# Patient Record
Sex: Male | Born: 1950 | Race: White | Hispanic: No | Marital: Married | Smoking: Current every day smoker
Health system: Southern US, Community
[De-identification: ages and names within clinical notes are randomized; demographics above are authoritative.]

## PROBLEM LIST (undated history)

## (undated) DIAGNOSIS — M5030 Other cervical disc degeneration, unspecified cervical region: Secondary | ICD-10-CM

## (undated) DIAGNOSIS — F329 Major depressive disorder, single episode, unspecified: Secondary | ICD-10-CM

## (undated) DIAGNOSIS — F32A Depression, unspecified: Secondary | ICD-10-CM

## (undated) DIAGNOSIS — F419 Anxiety disorder, unspecified: Secondary | ICD-10-CM

## (undated) DIAGNOSIS — I10 Essential (primary) hypertension: Secondary | ICD-10-CM

## (undated) DIAGNOSIS — G4700 Insomnia, unspecified: Secondary | ICD-10-CM

## (undated) HISTORY — PX: TONSILLECTOMY: SUR1361

## (undated) HISTORY — DX: Major depressive disorder, single episode, unspecified: F32.9

## (undated) HISTORY — PX: OTHER SURGICAL HISTORY: SHX169

## (undated) HISTORY — DX: Depression, unspecified: F32.A

## (undated) HISTORY — PX: VASECTOMY: SHX75

## (undated) HISTORY — DX: Anxiety disorder, unspecified: F41.9

## (undated) HISTORY — DX: Other cervical disc degeneration, unspecified cervical region: M50.30

## (undated) HISTORY — DX: Insomnia, unspecified: G47.00

## (undated) HISTORY — DX: Essential (primary) hypertension: I10

---

## 1999-04-01 HISTORY — PX: COLONOSCOPY: SHX174

## 1999-09-23 ENCOUNTER — Encounter (HOSPITAL_BASED_OUTPATIENT_CLINIC_OR_DEPARTMENT_OTHER): Payer: Self-pay | Admitting: General Surgery

## 1999-09-25 ENCOUNTER — Encounter (INDEPENDENT_AMBULATORY_CARE_PROVIDER_SITE_OTHER): Payer: Self-pay | Admitting: *Deleted

## 1999-09-25 ENCOUNTER — Ambulatory Visit (HOSPITAL_COMMUNITY): Admission: RE | Admit: 1999-09-25 | Discharge: 1999-09-25 | Payer: Self-pay | Admitting: General Surgery

## 2005-01-14 ENCOUNTER — Ambulatory Visit: Payer: Self-pay | Admitting: Internal Medicine

## 2005-09-04 ENCOUNTER — Ambulatory Visit: Payer: Self-pay | Admitting: Internal Medicine

## 2005-09-05 ENCOUNTER — Ambulatory Visit: Payer: Self-pay | Admitting: Internal Medicine

## 2006-01-05 ENCOUNTER — Ambulatory Visit: Payer: Self-pay | Admitting: Internal Medicine

## 2006-07-31 DIAGNOSIS — Z9089 Acquired absence of other organs: Secondary | ICD-10-CM

## 2006-08-13 ENCOUNTER — Encounter: Payer: Self-pay | Admitting: Internal Medicine

## 2006-08-13 ENCOUNTER — Ambulatory Visit: Payer: Self-pay | Admitting: Internal Medicine

## 2006-08-13 LAB — CONVERTED CEMR LAB
ALT: 38 units/L (ref 0–40)
AST: 30 units/L (ref 0–37)
Albumin: 4.1 g/dL (ref 3.5–5.2)
Alkaline Phosphatase: 68 units/L (ref 39–117)
BUN: 26 mg/dL — ABNORMAL HIGH (ref 6–23)
Basophils Absolute: 0 10*3/uL (ref 0.0–0.1)
Basophils Relative: 0.5 % (ref 0.0–1.0)
Bilirubin, Direct: 0.1 mg/dL (ref 0.0–0.3)
CO2: 30 meq/L (ref 19–32)
Calcium: 9.9 mg/dL (ref 8.4–10.5)
Chloride: 102 meq/L (ref 96–112)
Cholesterol: 183 mg/dL (ref 0–200)
Creatinine, Ser: 1.1 mg/dL (ref 0.4–1.5)
Creatinine,U: 103.2 mg/dL
Direct LDL: 63.3 mg/dL
Eosinophils Absolute: 0.1 10*3/uL (ref 0.0–0.6)
Eosinophils Relative: 1.3 % (ref 0.0–5.0)
Free T4: 0.7 ng/dL (ref 0.6–1.6)
GFR calc Af Amer: 89 mL/min
GFR calc non Af Amer: 73 mL/min
Glucose, Bld: 131 mg/dL — ABNORMAL HIGH (ref 70–99)
HCT: 43.8 % (ref 39.0–52.0)
HDL: 40.6 mg/dL (ref >39.0)
Hemoglobin: 15.2 g/dL (ref 13.0–17.0)
Hgb A1c MFr Bld: 5.8 % (ref 4.6–6.0)
Lymphocytes Relative: 22.8 % (ref 12.0–46.0)
MCHC: 34.7 g/dL (ref 30.0–36.0)
MCV: 90.7 fL (ref 78.0–100.0)
Microalb Creat Ratio: 25.2 mg/g (ref 0.0–30.0)
Microalb, Ur: 2.6 mg/dL — ABNORMAL HIGH (ref 0.0–1.9)
Monocytes Absolute: 0.6 10*3/uL (ref 0.2–0.7)
Monocytes Relative: 8.9 % (ref 3.0–11.0)
Neutro Abs: 4.5 10*3/uL (ref 1.4–7.7)
Neutrophils Relative %: 66.5 % (ref 43.0–77.0)
Platelets: 205 10*3/uL (ref 150–400)
Potassium: 4.3 meq/L (ref 3.5–5.1)
RBC: 4.83 M/uL (ref 4.22–5.81)
RDW: 11.8 % (ref 11.5–14.6)
Sodium: 137 meq/L (ref 135–145)
T3, Free: 2.9 pg/mL (ref 2.3–4.2)
TSH: 2.57 microintl units/mL (ref 0.35–5.50)
Testosterone: 213.69 ng/dL — ABNORMAL LOW (ref 350.00–890)
Total Bilirubin: 0.8 mg/dL (ref 0.3–1.2)
Total CHOL/HDL Ratio: 4.5
Total Protein: 7.6 g/dL (ref 6.0–8.3)
Triglycerides: 429 mg/dL (ref 0–149)
VLDL: 86 mg/dL — ABNORMAL HIGH (ref 0–40)
WBC: 6.8 10*3/uL (ref 4.5–10.5)

## 2006-09-03 ENCOUNTER — Encounter: Payer: Self-pay | Admitting: Internal Medicine

## 2006-10-07 ENCOUNTER — Encounter: Payer: Self-pay | Admitting: Internal Medicine

## 2006-12-20 ENCOUNTER — Emergency Department (HOSPITAL_COMMUNITY): Admission: EM | Admit: 2006-12-20 | Discharge: 2006-12-21 | Payer: Self-pay | Admitting: Emergency Medicine

## 2006-12-23 ENCOUNTER — Encounter: Payer: Self-pay | Admitting: Internal Medicine

## 2007-01-21 ENCOUNTER — Ambulatory Visit: Payer: Self-pay | Admitting: Internal Medicine

## 2007-01-24 LAB — CONVERTED CEMR LAB
Creatinine,U: 169.3 mg/dL
Hgb A1c MFr Bld: 5.8 % (ref 4.6–6.0)
Microalb Creat Ratio: 5.9 mg/g (ref 0.0–30.0)
Microalb, Ur: 1 mg/dL (ref 0.0–1.9)

## 2007-01-26 ENCOUNTER — Encounter (INDEPENDENT_AMBULATORY_CARE_PROVIDER_SITE_OTHER): Payer: Self-pay | Admitting: *Deleted

## 2007-02-05 ENCOUNTER — Encounter: Payer: Self-pay | Admitting: Internal Medicine

## 2007-06-14 ENCOUNTER — Encounter: Payer: Self-pay | Admitting: Internal Medicine

## 2007-10-05 ENCOUNTER — Telehealth (INDEPENDENT_AMBULATORY_CARE_PROVIDER_SITE_OTHER): Payer: Self-pay | Admitting: *Deleted

## 2007-10-05 ENCOUNTER — Ambulatory Visit: Payer: Self-pay | Admitting: Internal Medicine

## 2007-10-05 DIAGNOSIS — M5412 Radiculopathy, cervical region: Secondary | ICD-10-CM | POA: Insufficient documentation

## 2007-10-15 ENCOUNTER — Telehealth (INDEPENDENT_AMBULATORY_CARE_PROVIDER_SITE_OTHER): Payer: Self-pay | Admitting: *Deleted

## 2007-11-09 ENCOUNTER — Encounter (INDEPENDENT_AMBULATORY_CARE_PROVIDER_SITE_OTHER): Payer: Self-pay | Admitting: *Deleted

## 2007-11-09 ENCOUNTER — Telehealth (INDEPENDENT_AMBULATORY_CARE_PROVIDER_SITE_OTHER): Payer: Self-pay | Admitting: *Deleted

## 2007-11-24 ENCOUNTER — Ambulatory Visit: Payer: Self-pay | Admitting: Internal Medicine

## 2007-11-24 DIAGNOSIS — I10 Essential (primary) hypertension: Secondary | ICD-10-CM | POA: Insufficient documentation

## 2007-11-24 DIAGNOSIS — L723 Sebaceous cyst: Secondary | ICD-10-CM

## 2007-12-15 ENCOUNTER — Encounter: Admission: RE | Admit: 2007-12-15 | Discharge: 2008-02-07 | Payer: Self-pay | Admitting: Orthopedic Surgery

## 2008-02-16 ENCOUNTER — Telehealth (INDEPENDENT_AMBULATORY_CARE_PROVIDER_SITE_OTHER): Payer: Self-pay | Admitting: *Deleted

## 2008-02-16 DIAGNOSIS — E291 Testicular hypofunction: Secondary | ICD-10-CM | POA: Insufficient documentation

## 2008-02-22 ENCOUNTER — Encounter (INDEPENDENT_AMBULATORY_CARE_PROVIDER_SITE_OTHER): Payer: Self-pay | Admitting: *Deleted

## 2008-05-17 ENCOUNTER — Encounter: Payer: Self-pay | Admitting: Internal Medicine

## 2008-08-15 ENCOUNTER — Encounter: Payer: Self-pay | Admitting: Internal Medicine

## 2008-12-26 ENCOUNTER — Ambulatory Visit: Payer: Self-pay | Admitting: Internal Medicine

## 2008-12-26 DIAGNOSIS — N400 Enlarged prostate without lower urinary tract symptoms: Secondary | ICD-10-CM

## 2008-12-26 DIAGNOSIS — F329 Major depressive disorder, single episode, unspecified: Secondary | ICD-10-CM

## 2009-01-03 ENCOUNTER — Ambulatory Visit: Payer: Self-pay | Admitting: Internal Medicine

## 2009-01-05 LAB — CONVERTED CEMR LAB
ALT: 38 units/L (ref 0–53)
AST: 31 units/L (ref 0–37)
Albumin: 4.1 g/dL (ref 3.5–5.2)
Alkaline Phosphatase: 58 units/L (ref 39–117)
BUN: 24 mg/dL — ABNORMAL HIGH (ref 6–23)
Basophils Absolute: 0 10*3/uL (ref 0.0–0.1)
Basophils Relative: 0.6 % (ref 0.0–3.0)
Bilirubin, Direct: 0 mg/dL (ref 0.0–0.3)
CO2: 30 meq/L (ref 19–32)
Calcium: 9.7 mg/dL (ref 8.4–10.5)
Chloride: 102 meq/L (ref 96–112)
Cholesterol: 156 mg/dL (ref 0–200)
Creatinine, Ser: 1.2 mg/dL (ref 0.4–1.5)
Direct LDL: 112.2 mg/dL
Eosinophils Absolute: 0.1 10*3/uL (ref 0.0–0.7)
Eosinophils Relative: 1.6 % (ref 0.0–5.0)
GFR calc non Af Amer: 65.67 mL/min (ref >60)
Glucose, Bld: 138 mg/dL — ABNORMAL HIGH (ref 70–99)
HCT: 48.9 % (ref 39.0–52.0)
HDL: 38.5 mg/dL — ABNORMAL LOW (ref >39.00)
Hemoglobin: 16.3 g/dL (ref 13.0–17.0)
Lymphocytes Relative: 21.6 % (ref 12.0–46.0)
Lymphs Abs: 1.7 10*3/uL (ref 0.7–4.0)
MCHC: 33.4 g/dL (ref 30.0–36.0)
MCV: 96.6 fL (ref 78.0–100.0)
Monocytes Absolute: 0.5 10*3/uL (ref 0.1–1.0)
Monocytes Relative: 6.8 % (ref 3.0–12.0)
Neutro Abs: 5.5 10*3/uL (ref 1.4–7.7)
Neutrophils Relative %: 69.4 % (ref 43.0–77.0)
Platelets: 172 10*3/uL (ref 150.0–400.0)
Potassium: 5.4 meq/L — ABNORMAL HIGH (ref 3.5–5.1)
RBC: 5.06 M/uL (ref 4.22–5.81)
RDW: 11.6 % (ref 11.5–14.6)
Sodium: 140 meq/L (ref 135–145)
TSH: 2.57 microintl units/mL (ref 0.35–5.50)
Testosterone: 238.61 ng/dL — ABNORMAL LOW (ref 350.00–890.00)
Total Bilirubin: 0.8 mg/dL (ref 0.3–1.2)
Total CHOL/HDL Ratio: 4
Total Protein: 7.7 g/dL (ref 6.0–8.3)
Triglycerides: 230 mg/dL — ABNORMAL HIGH (ref 0.0–149.0)
VLDL: 46 mg/dL — ABNORMAL HIGH (ref 0.0–40.0)
WBC: 7.8 10*3/uL (ref 4.5–10.5)

## 2009-01-09 ENCOUNTER — Encounter (INDEPENDENT_AMBULATORY_CARE_PROVIDER_SITE_OTHER): Payer: Self-pay | Admitting: *Deleted

## 2009-01-09 LAB — CONVERTED CEMR LAB: Hgb A1c MFr Bld: 5.8 % (ref 4.6–6.5)

## 2009-01-10 ENCOUNTER — Ambulatory Visit: Payer: Self-pay | Admitting: Internal Medicine

## 2009-06-04 ENCOUNTER — Ambulatory Visit: Payer: Self-pay | Admitting: Internal Medicine

## 2009-06-04 DIAGNOSIS — H6980 Other specified disorders of Eustachian tube, unspecified ear: Secondary | ICD-10-CM

## 2009-10-09 ENCOUNTER — Ambulatory Visit: Payer: Self-pay | Admitting: Internal Medicine

## 2009-10-09 ENCOUNTER — Telehealth: Payer: Self-pay | Admitting: Internal Medicine

## 2009-10-09 DIAGNOSIS — B029 Zoster without complications: Secondary | ICD-10-CM | POA: Insufficient documentation

## 2010-01-10 ENCOUNTER — Telehealth (INDEPENDENT_AMBULATORY_CARE_PROVIDER_SITE_OTHER): Payer: Self-pay | Admitting: *Deleted

## 2010-04-30 NOTE — Progress Notes (Signed)
Summary: refill  Phone Note Refill Request Message from:  Fax from Pharmacy on January 10, 2010 11:52 AM  Refills Requested: Medication #1:  METOPROLOL TARTRATE 50 MG  TABS 1 by mouth bid [facility] - fax [phone]  Initial call taken by: Okey Regal [place],  January 10, 2010 11:58 AM    Prescriptions: METOPROLOL TARTRATE 50 MG  TABS (METOPROLOL TARTRATE) 1 by mouth bid  #180 x 1   Entered by:   Shonna Chock CMA   Authorized by:   Marga Melnick MD   Signed by:   Shonna Chock CMA on 01/10/2010   Method used:   Electronically to        [facility] [address] (retail)       [address]       [place], [place]  [address]       Ph: [phone] or [phone]       Fax: [phone]   RxID:   [id number]

## 2010-04-30 NOTE — Assessment & Plan Note (Signed)
Summary: FLUID IN RT EAR/RH.......Marland Kitchen   Vital Signs:  Patient profile:   60 year old male Weight:      192 pounds Temp:     98.8 degrees F oral Pulse rate:   72 / minute Resp:     15 per minute BP sitting:   130 / 78  (left arm)  Vitals Entered By: Jeremy Johann CMA (June 04, 2009 3:28 PM) CC: Fluid in right ear Comments REVIEWED MED LIST, PATIENT AGREED DOSE AND INSTRUCTION CORRECT    CC:  Fluid in right ear.  History of Present Illness: Bronchitis in [facility] 05/20/2009 after flying; Rx: steroids, antibiotics, Mucinex D. Tessalon perles Rxed by his brother, MD. Now sensation of water in R ear .  Allergies: 1)  ! Codeine  Review of Systems General:  Denies chills, fever, and sweats. ENT:  Complains of nasal congestion and sinus pressure; denies ear discharge; No frontal headache, facialpain or purulence. Resp:  Complains of cough; denies shortness of breath, sputum productive, and wheezing. Allergy:  Denies itching eyes and sneezing.  Physical Exam  General:  well-nourished,in no acute distress; alert,appropriate and cooperative throughout examination Ears:  R ear normal.  L TM dull Nose:  External nasal examination shows no deformity or inflammation. Nasal mucosa are pink and moist without lesions or exudates.decreased R nare Lungs:  Normal respiratory effort, chest expands symmetrically. Lungs are clear to auscultation, no crackles or wheezes. 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 Take as directed 2)  Lotensin 40 Mg Tabs (Benazepril hcl) .Marland Kitchen.. 1 by mouth qd 3)  Metoprolol Tartrate 50 Mg Tabs (Metoprolol tartrate) .Marland Kitchen.. 1 by mouth bid 4)  Wellbutrin Xl 300 Mg Tb24 (Bupropion hcl) .Marland Kitchen.. 1 by mouth qd 5)  Lexapro 20 Mg Tabs (Escitalopram oxalate) .Marland Kitchen.. 1 by mouth  qd 6)  Ambien Cr 12.5 Mg Cr-tabs (Zolpidem tartrate) .Marland Kitchen.. 1 by mouth once daily 7)  Fluticasone Propionate 50 Mcg/act Susp (Fluticasone propionate) .Marland Kitchen.. 1 spray two times a day as "crossover technique"  Patient Instructions: 1)  Go to WebMD for Eustachian Tube Dysfunction .Fill Rx for Zithromax if purulence present 2)  Drink as much fluid as you can tolerate for the next few days. Prescriptions: FLUTICASONE PROPIONATE 50 MCG/ACT SUSP (FLUTICASONE PROPIONATE) 1 spray two times a day as "crossover technique"  #1 x 5   Entered and Authorized by:   Marga Melnick MD   Signed by:   Marga Melnick MD on 06/04/2009   Method used:   Faxed to ...       [facility] [address] (retail)       [address]       [place], [place]  [address]       Ph: [phone] or [phone]       Fax: [phone]   RxID:   [id number]

## 2010-04-30 NOTE — Assessment & Plan Note (Signed)
Summary: shingles/cbs   Vital Signs:  Patient profile:   60 year old male Height:      70 inches (177.80 cm) Weight:      197.50 pounds (89.77 kg) BMI:     28.44 Temp:     98.4 degrees F (36.89 degrees C) Resp:     17 per minute BP sitting:   130 / 82  (right arm) Cuff size:   large  Vitals Entered By: Lucious Groves (October 09, 2009 12:30 PM) CC: C/O possible shingles that began Thurs. while he was out of the country./kb Is Patient Diabetic? No Pain Assessment Patient in pain? no      Comments Patient notes that he had a fever on Sunday and also had fluy like symptoms and increased sweating on Sunday. Per pt he is not using Fluticasone nasal spray./kb   CC:  C/O possible shingles that began Thurs. while [place] was [place] of the country./kb.  History of Present Illness: Onset as "irritation" R forehead 10/04/2009; blisters as of 07/08 while in [place]. Arthralgias & myalgias 07/10 with malaise & marked diaphoresis . Swelling above  OD as of 07/11.  Current Medications (verified): 1)  Levitra 20 Mg  Tabs (Vardenafil Hcl) .... Take As Directed 2)  Lotensin 40 Mg  Tabs (Benazepril Hcl) .Marland Kitchen.. 1 By Mouth Qd 3)  Metoprolol Tartrate 50 Mg  Tabs (Metoprolol Tartrate) .Marland Kitchen.. 1 By Mouth Bid 4)  Wellbutrin Xl 300 Mg  Tb24 (Bupropion Hcl) .Marland Kitchen.. 1 By Mouth Qd 5)  Lexapro 20 Mg  Tabs (Escitalopram Oxalate) .Marland Kitchen.. 1 By Mouth Qd 6)  Ambien Cr 12.5 Mg Cr-Tabs (Zolpidem Tartrate) .Marland Kitchen.. 1 By Mouth Once Daily 7)  Fluticasone Propionate 50 Mcg/act Susp (Fluticasone Propionate) .Marland Kitchen.. 1 Spray Two Times A Day As "crossover Technique"  Allergies (verified): 1)  ! Codeine  Review of Systems General:  Complains of chills; denies fever. Eyes:  Denies discharge, eye pain, red eye, and vision loss-1 eye.  Physical Exam  General:  Classic zoster R forehead,well-nourished,in no acute distress; alert,appropriate and cooperative throughout examination Eyes:  No corneal or conjunctival inflammation noted. EOMI. Perrla.   Vision grossly normal.Minor scleritis OD. OD lid edematous Ears:  External ear exam shows no significant lesions or deformities.  Otoscopic examination reveals clear canals, tympanic membranes are intact bilaterally without bulging, retraction, inflammation or discharge. Hearing is grossly normal bilaterally. Nose:  External nasal examination shows no deformity or inflammation. Nasal mucosa are pink and moist without lesions or exudates. 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 Take as directed 2)  Lotensin 40 Mg Tabs (Benazepril hcl) .Marland Kitchen.. 1 by mouth qd 3)  Metoprolol Tartrate 50 Mg Tabs (Metoprolol tartrate) .Marland Kitchen.. 1 by mouth bid 4)  Wellbutrin Xl 300 Mg Tb24 (Bupropion hcl) .Marland Kitchen.. 1 by mouth qd 5)  Lexapro 20 Mg Tabs (Escitalopram oxalate) .Marland Kitchen.. 1 by mouth qd 6)  Ambien Cr 12.5 Mg Cr-tabs (Zolpidem tartrate) .Marland Kitchen.. 1 by mouth once daily 7)  Fluticasone Propionate 50 Mcg/act Susp (Fluticasone propionate) .Marland Kitchen.. 1 spray two times a day as "crossover technique" 8)  Famciclovir 500 Mg Tabs (Famciclovir) .Marland Kitchen.. 1 three times a day 9)  Gabapentin 100 Mg Caps (Gabapentin) .Marland Kitchen.. 1 every 8 hrs as needed for pain  Patient Instructions: 1)  See  your Ophthalmologist as precaution. Saline only to zoster lesions. Prescriptions: GABAPENTIN 100 MG CAPS (GABAPENTIN) 1 every 8 hrs as needed for pain  #30 x 1   Entered and Authorized by:   Marga Melnick MD   Signed by:   Marga Melnick MD on 10/09/2009   Method used:   Faxed to ...       [facility] [address] (retail)       [address]       [place], [place]  [address]       Ph: [phone] or [phone]       Fax: [phone]   RxID:   [id number] FAMCICLOVIR 500 MG TABS (FAMCICLOVIR) 1  three times a day  #21 x 0   Entered and Authorized by:   Marga Melnick MD   Signed by:   Marga Melnick MD on 10/09/2009   Method used:   Faxed to ...       [facility] [address] (retail)       [address]       [place], [place]  [address]       Ph: [phone] or [phone]       Fax: [phone]   RxID:   [id number]

## 2010-04-30 NOTE — Progress Notes (Signed)
Summary: Referral  Phone Note Call from Patient   Caller: Patient Summary of Call: pt calls and states he was offered a referral to an eye doctor in which he denied at the time. He now would like to have the referral for the eye doctor. please advise [phone] Initial call taken by: Lavell Islam,  October 09, 2009 1:57 PM  Follow-up for Phone Call        see referral Follow-up by: Marga Melnick MD,  October 09, 2009 5:44 PM  Additional Follow-up for Phone Call Additional follow up Details #1::        Patient has an appt on 7.19.11 @ 10:30am at the [facility].Harold Barban  October 10, 2009 11:24 AM Additional Follow-up by: Harold Barban,  October 10, 2009 11:25 AM     Appended Document: Referral he has Zoster (shingles); he needs to be sen ASAP to R/O eye involement

## 2010-05-25 ENCOUNTER — Emergency Department (HOSPITAL_BASED_OUTPATIENT_CLINIC_OR_DEPARTMENT_OTHER)
Admission: EM | Admit: 2010-05-25 | Discharge: 2010-05-25 | Disposition: A | Discharge status: Home / Self Care | Payer: BC Managed Care – PPO | Attending: Emergency Medicine | Admitting: Emergency Medicine

## 2010-05-25 ENCOUNTER — Emergency Department (INDEPENDENT_AMBULATORY_CARE_PROVIDER_SITE_OTHER): Payer: BC Managed Care – PPO

## 2010-05-25 DIAGNOSIS — R059 Cough, unspecified: Secondary | ICD-10-CM | POA: Insufficient documentation

## 2010-05-25 DIAGNOSIS — R05 Cough: Secondary | ICD-10-CM | POA: Insufficient documentation

## 2010-05-25 DIAGNOSIS — F341 Dysthymic disorder: Secondary | ICD-10-CM | POA: Insufficient documentation

## 2010-05-25 DIAGNOSIS — R0789 Other chest pain: Secondary | ICD-10-CM

## 2010-05-25 DIAGNOSIS — I10 Essential (primary) hypertension: Secondary | ICD-10-CM | POA: Insufficient documentation

## 2010-05-25 DIAGNOSIS — Z79899 Other long term (current) drug therapy: Secondary | ICD-10-CM | POA: Insufficient documentation

## 2010-05-25 DIAGNOSIS — J40 Bronchitis, not specified as acute or chronic: Secondary | ICD-10-CM | POA: Insufficient documentation

## 2010-07-09 ENCOUNTER — Other Ambulatory Visit: Payer: Self-pay

## 2010-07-09 MED ORDER — BENAZEPRIL HCL 40 MG PO TABS: 40.0000 mg | ORAL_TABLET | Freq: Every day | ORAL | Status: DC

## 2010-07-25 ENCOUNTER — Other Ambulatory Visit: Payer: Self-pay | Admitting: Internal Medicine

## 2010-07-25 NOTE — Telephone Encounter (Signed)
Patient needs to schedule a CPX  

## 2010-08-16 NOTE — Op Note (Signed)
[facility]  Patient:    Rodney Parker, Rodney Parker                  MRN: [id number] Proc. Date: 09/25/99 Adm. Date:  [id number] Disc. Date: [id number] Attending:  Sonda Primes CC:         Mardene Celeste. Lurene Shadow, M.D. (2)                           Operative Report  PREOPERATIVE DIAGNOSIS:  Right superclavicular lipoma.  POSTOPERATIVE DIAGNOSIS:  Right superclavicular lipoma.  OPERATION PERFORMED:  Excision of mass (lipoma) right neck.  SURGEON:  Mardene Celeste. Lurene Shadow, M.D.  ASSISTANT:  Nurse.  ANESTHESIA:  General.  INDICATIONS FOR PROCEDURE:  Mr. Snowball is a 60 year old gentleman presenting with a mass in the right neck which has been increasing in size. It has not caused any pain.  It does get in the way of his clothing to some extent and he is rather concerned that it may be malignant despite assurances. He was brought to the operating room now for excision of this right neck mass.  DESCRIPTION OF PROCEDURE:  Following the induction of satisfactory general anesthesia, with the patient positioned supinely, the right neck was prepped and draped to be included in a sterile operative field.  Her head was turned to the left.  I infiltrated the region around the lipoma with 1% Xylocaine with epinephrine.  A transverse incision in the neck folds was carried down through skin and subcutaneous tissues down across the platysma muscle and down to the capsule of the lipoma which extended down to and beyond behind the sternocleidomastoid muscle.  The lipoma was dissected free from this space maintaining hemostasis throughout the entire course of dissection.  It was removed and then forwarded for pathologic evaluation.  All areas of dissection were checked for hemostasis.  Sponge, instrument and sharp counts were verified and the wound closed in layers as follows.  Platysma muscle and subcutaneous tissues were closed with interrupted 3-0 Vicryl sutures.   Skin closed with a 5-0 running Monocryl suture and then reinforced with Steri-Strips.  Sterile dressings applied.  Anesthetic reversed.  Patient removed from the operating room to the recovery room in stable condition having tolerated the procedure well. DD:  09/25/99 TD:  09/26/99 Job: [id number]

## 2010-09-20 ENCOUNTER — Other Ambulatory Visit: Payer: Self-pay | Admitting: Internal Medicine

## 2010-10-26 ENCOUNTER — Other Ambulatory Visit: Payer: Self-pay | Admitting: Internal Medicine

## 2010-10-28 NOTE — Telephone Encounter (Signed)
Patient needs to schedule a CPX  

## 2010-11-29 ENCOUNTER — Encounter: Payer: Self-pay | Admitting: Internal Medicine

## 2010-11-29 ENCOUNTER — Ambulatory Visit (INDEPENDENT_AMBULATORY_CARE_PROVIDER_SITE_OTHER): Payer: BC Managed Care – PPO | Admitting: Internal Medicine

## 2010-11-29 VITALS — BP 138/86 | HR 77 | Temp 98.5°F | Wt 194.0 lb

## 2010-11-29 DIAGNOSIS — J209 Acute bronchitis, unspecified: Secondary | ICD-10-CM

## 2010-11-29 MED ORDER — BENZONATATE 200 MG PO CAPS: 200.0000 mg | ORAL_CAPSULE | Freq: Three times a day (TID) | ORAL | Status: AC | PRN

## 2010-11-29 MED ORDER — AZITHROMYCIN 250 MG PO TABS: ORAL_TABLET | ORAL | Status: AC

## 2010-11-29 MED ORDER — METOPROLOL TARTRATE 50 MG PO TABS: 50.0000 mg | ORAL_TABLET | Freq: Two times a day (BID) | ORAL | Status: DC

## 2010-11-29 MED ORDER — BENAZEPRIL HCL 40 MG PO TABS: 40.0000 mg | ORAL_TABLET | Freq: Every day | ORAL | Status: DC

## 2010-11-29 NOTE — Progress Notes (Signed)
  Subjective:    Patient ID: Rodney Parker, male    DOB: 17-May-1950, 60 y.o.   MRN: [id number]  HPI Cough Onset:11/26/2010 Extrinsic symptoms:itchy eyes, sneezing: minor watery eyes  Infectious symptoms :fever, purulent secretions :no Chest symptoms: pleuritic pain, sputum production, hemoptysis,dyspnea,wheezing: GI symptoms: Dyspepsia, reflux: no Occupational/environmental exposures:no Smoking: 6-8 cigarettes / day ACE inhibitor: Benazepril  40 mg Treatment/efficacy:Mucinex Past medical history/family history pulmonary disease: father COPD    Review of Systems  He denies  nasal purulence; facial pain;  fatigue; fever; headache; halitosis; earache or  dental pain. 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  No  lymphadenopathy about the head, neck, or axilla noted.   Eyes: No conjunctival inflammation or lid edema is present.   Ears:  External ear exam shows no significant lesions or deformities.  Otoscopic examination reveals clear canals, tympanic membranes are intact bilaterally without bulging, retraction, inflammation or discharge.  Nose:  External nasal examination shows no deformity or inflammation. Nasal mucosa are pink and moist without lesions or exudates. No septal dislocation .No obstruction to airflow.   Oral exam: Dental hygiene is good; lips and gums are healthy appearing.There is no oropharyngeal erythema or exudate noted.    Heart:  Normal rate and regular rhythm. S1 and S2 normal without gallop, murmur, click, rub .S4  Lungs:Chest clear to auscultation; no wheezes, rhonchi,rales ,or rubs present.No increased work of breathing.    Extremities:  No cyanosis, edema, or clubbing  noted    Skin: Warm & dry          Assessment & Plan:  #1 bronchitis, acute. He is on an ACE inhibitor but this appears to be in acute infectious  process.  Plan: See orders and recommendations.

## 2010-11-29 NOTE — Patient Instructions (Signed)
Plain Mucinex for thick secretions ;force NON dairy fluids for next 48 hrs. Use a Neti pot daily as needed for sinus congestion 

## 2011-01-09 LAB — DIFFERENTIAL
Eosinophils Absolute: 0.1
Lymphocytes Relative: 28
Lymphs Abs: 2.1
Monocytes Relative: 9
Neutrophils Relative %: 62

## 2011-01-09 LAB — BASIC METABOLIC PANEL
BUN: 15
Chloride: 108
Glucose, Bld: 156 — ABNORMAL HIGH
Potassium: 3.8

## 2011-01-09 LAB — PROTIME-INR: INR: 0.8

## 2011-01-09 LAB — CBC
MCV: 89.1
RBC: 4.92
WBC: 7.7

## 2011-01-09 LAB — POCT CARDIAC MARKERS
CKMB, poc: <1 — ABNORMAL LOW
Troponin i, poc: <0.05

## 2011-02-10 ENCOUNTER — Ambulatory Visit (INDEPENDENT_AMBULATORY_CARE_PROVIDER_SITE_OTHER): Payer: BC Managed Care – PPO | Admitting: Internal Medicine

## 2011-02-10 ENCOUNTER — Encounter: Payer: Self-pay | Admitting: Internal Medicine

## 2011-02-10 VITALS — BP 142/88 | HR 70 | Temp 98.5°F | Resp 12 | Ht 71.0 in | Wt 194.8 lb

## 2011-02-10 DIAGNOSIS — Z23 Encounter for immunization: Secondary | ICD-10-CM

## 2011-02-10 DIAGNOSIS — Z136 Encounter for screening for cardiovascular disorders: Secondary | ICD-10-CM

## 2011-02-10 DIAGNOSIS — I10 Essential (primary) hypertension: Secondary | ICD-10-CM

## 2011-02-10 DIAGNOSIS — Z0000 Encounter for general adult medical examination without abnormal findings: Secondary | ICD-10-CM

## 2011-02-10 NOTE — Progress Notes (Signed)
Subjective:    Patient ID: Rodney Parker, male    DOB: 04/29/1950, 60 y.o.   MRN: [id number]  HPI  Rodney Parker  is here for a physical;acute issues include ? Mass L back noted 1 month ago @ massage session.      Review of Systems  HYPERTENSION: Disease Monitoring  Blood pressure range:consistently < 135/85 on average  Chest pain: no   Dyspnea: no   Claudication: no   Medication compliance: yes,   Medication Side Effects  Lightheadedness: no   Urinary frequency: no   Edema: no    Preventitive Healthcare:  Exercise: no,    Diet Pattern: no specific plan  Salt Restriction: no added salt  (see digital rectal exam below). He denies hematuria, dysuria, pyuria, hesitancy or other voiding or incontinence issues.       Objective:   Physical Exam Gen.: Healthy and well-nourished in appearance. Alert, appropriate and cooperative throughout exam. Head: Normocephalic without obvious abnormalities;  pattern alopecia  Eyes: No corneal or conjunctival inflammation noted. Pupils equal round reactive to light and accommodation. Fundal exam is benign without hemorrhages, exudate, papilledema. Extraocular motion intact. Vision grossly normal. Ears: External  ear exam reveals no significant lesions or deformities. Canals clear .TMs normal. Hearing is grossly normal bilaterally. Nose: External nasal exam reveals no deformity or inflammation. Nasal mucosa are pink and moist. No lesions or exudates noted.   Mouth: Oral mucosa and oropharynx reveal no lesions or exudates. Teeth in good repair. Neck: No deformities, masses, or tenderness noted. Range of motion & . Thyroid  normal. Lungs: Normal respiratory effort; chest expands symmetrically. Lungs are clear to auscultation without rales, wheezes, or increased work of breathing. Heart: Normal rate and rhythm. Normal S1 and S2. No gallop, click, or rub. S 4 w/o  murmur. Abdomen: Bowel sounds normal; abdomen soft and nontender. No masses,  organomegaly or hernias noted. Genitalia/ DRE: Genital exam is unremarkable. Prostate is 2-2-1/2+ enlarged without nodularity, significant asymmetry  or induration.   .                                                                                   Musculoskeletal/extremities: No deformity or scoliosis noted of  the thoracic or lumbar spine but R thoracic muscles > L. Large lipoma L mid -lower posterior thoracic area. . No clubbing, cyanosis, edema, or deformity noted. Range of motion  normal .Tone & strength  normal.Joints normal. Nail health  good. Vascular: Carotid, radial artery, dorsalis pedis and  posterior tibial pulses are full and equal. No bruits present. Neurologic: Alert and oriented x3. Deep tendon reflexes symmetrical and normal.         Skin: Intact without suspicious lesions or rashes. Lymph: No cervical, axillary, or inguinal lymphadenopathy present. Psych: Mood and affect are normal. Normally interactive  Assessment & Plan:   #1 comprehensive physical exam; no acute findings #2 see Problem List with Assessments & Recommendations Plan: see Orders

## 2011-02-10 NOTE — Patient Instructions (Signed)
Blood Pressure Goal  Ideally is an AVERAGE < 135/85. This AVERAGE should be calculated from @ least 5-7 BP readings taken @ different times of day on different days of week. You should not respond to isolated BP readings , but rather the AVERAGE for that week  Preventive Health Care: Exercise at least 30-45 minutes a day,  3-4 days a week.  Eat a low-fat diet with lots of fruits and vegetables, up to 7-9 servings per day. Consume less than 40 grams of sugar per day from foods & drinks with High Fructose Corn Sugar as # 1,2,3 or # 4 on label.

## 2011-02-11 LAB — CBC WITH DIFFERENTIAL/PLATELET
Basophils Relative: 0.6 % (ref 0.0–3.0)
Eosinophils Absolute: 0.1 10*3/uL (ref 0.0–0.7)
Eosinophils Relative: 1.8 % (ref 0.0–5.0)
Hemoglobin: 15.2 g/dL (ref 13.0–17.0)
Lymphocytes Relative: 35.3 % (ref 12.0–46.0)
MCHC: 34.4 g/dL (ref 30.0–36.0)
MCV: 94.2 fl (ref 78.0–100.0)
Neutro Abs: 4 10*3/uL (ref 1.4–7.7)
RBC: 4.7 Mil/uL (ref 4.22–5.81)
WBC: 7.7 10*3/uL (ref 4.5–10.5)

## 2011-02-11 LAB — HEPATIC FUNCTION PANEL
AST: 35 U/L (ref 0–37)
Albumin: 4 g/dL (ref 3.5–5.2)
Total Bilirubin: 0.7 mg/dL (ref 0.3–1.2)

## 2011-02-11 LAB — LIPID PANEL
HDL: 40.1 mg/dL (ref >39.00)
Total CHOL/HDL Ratio: 4
Triglycerides: 431 mg/dL — ABNORMAL HIGH (ref 0.0–149.0)

## 2011-02-11 LAB — PSA: PSA: 1.43 ng/mL (ref 0.10–4.00)

## 2011-02-11 LAB — TSH: TSH: 2.45 u[IU]/mL (ref 0.35–5.50)

## 2011-02-11 LAB — BASIC METABOLIC PANEL
CO2: 27 mEq/L (ref 19–32)
Calcium: 9.9 mg/dL (ref 8.4–10.5)
Chloride: 104 mEq/L (ref 96–112)
Sodium: 141 mEq/L (ref 135–145)

## 2011-02-12 LAB — LDL CHOLESTEROL, DIRECT: Direct LDL: 66.1 mg/dL

## 2011-02-24 ENCOUNTER — Telehealth: Payer: Self-pay | Admitting: Internal Medicine

## 2011-02-24 NOTE — Telephone Encounter (Signed)
Discuss with patient, referral put in.  

## 2011-02-24 NOTE — Telephone Encounter (Signed)
I recommend he get the records from the out of town facility; I'll refer him to gastroenterology

## 2011-02-26 ENCOUNTER — Encounter: Payer: Self-pay | Admitting: Gastroenterology

## 2011-02-26 ENCOUNTER — Telehealth: Payer: Self-pay | Admitting: Internal Medicine

## 2011-02-26 NOTE — Telephone Encounter (Signed)
Patient reports he has scheduled an appt with another GI group

## 2011-03-13 ENCOUNTER — Ambulatory Visit: Payer: BC Managed Care – PPO | Admitting: Gastroenterology

## 2011-03-31 ENCOUNTER — Other Ambulatory Visit: Payer: Self-pay | Admitting: Internal Medicine

## 2011-04-07 ENCOUNTER — Encounter: Payer: Self-pay | Admitting: Gastroenterology

## 2011-05-08 ENCOUNTER — Ambulatory Visit (AMBULATORY_SURGERY_CENTER): Payer: BC Managed Care – PPO | Admitting: *Deleted

## 2011-05-08 VITALS — Ht 71.0 in | Wt 197.9 lb

## 2011-05-08 DIAGNOSIS — Z1211 Encounter for screening for malignant neoplasm of colon: Secondary | ICD-10-CM

## 2011-05-08 MED ORDER — PEG-KCL-NACL-NASULF-NA ASC-C 100 G PO SOLR: ORAL | Status: DC

## 2011-05-08 NOTE — Progress Notes (Signed)
Pt takes Wellbutrin, Neurontin, and Lexapro.  I offered to schedule pt with propofol but pt did not want to change days.  He said that he understands that he may not sleep through procedure and would like to proceed with colonoscopy as planned.  Ezra Sites

## 2011-05-19 ENCOUNTER — Other Ambulatory Visit: Payer: Self-pay | Admitting: *Deleted

## 2011-05-19 MED ORDER — METOPROLOL TARTRATE 50 MG PO TABS: 50.0000 mg | ORAL_TABLET | Freq: Two times a day (BID) | ORAL | Status: DC

## 2011-05-19 NOTE — Telephone Encounter (Signed)
Rx sent 

## 2011-05-22 ENCOUNTER — Ambulatory Visit (AMBULATORY_SURGERY_CENTER): Payer: BC Managed Care – PPO | Admitting: Gastroenterology

## 2011-05-22 ENCOUNTER — Encounter: Payer: Self-pay | Admitting: Gastroenterology

## 2011-05-22 VITALS — BP 143/84 | HR 75 | Temp 96.0°F | Resp 20 | Ht 71.0 in | Wt 197.0 lb

## 2011-05-22 DIAGNOSIS — D126 Benign neoplasm of colon, unspecified: Secondary | ICD-10-CM

## 2011-05-22 DIAGNOSIS — Z1211 Encounter for screening for malignant neoplasm of colon: Secondary | ICD-10-CM

## 2011-05-22 MED ORDER — SODIUM CHLORIDE 0.9 % IV SOLN: 500.0000 mL | INTRAVENOUS | Status: DC

## 2011-05-22 NOTE — Progress Notes (Signed)
No complaints noted in the recovery room. Maw  Patient did not experience any of the following events: a burn prior to discharge; a fall within the facility; wrong site/side/patient/procedure/implant event; or a hospital transfer or hospital admission upon discharge from the facility. (G8907) Patient did not have preoperative order for IV antibiotic SSI prophylaxis. (G8918)  

## 2011-05-22 NOTE — Patient Instructions (Signed)
Resume your prior medications today.  Please call if any questions or concerns.  YOU HAD AN ENDOSCOPIC PROCEDURE TODAY AT THE [facility]: Refer to the procedure report that was given to you for any specific questions about what was found during the examination.  If the procedure report does not answer your questions, please call your gastroenterologist to clarify.  If you requested that your care partner not be given the details of your procedure findings, then the procedure report has been included in a sealed envelope for you to review at your convenience later.  YOU SHOULD EXPECT: Some feelings of bloating in the abdomen. Passage of more gas than usual.  Walking can help get rid of the air that was put into your GI tract during the procedure and reduce the bloating. If you had a lower endoscopy (such as a colonoscopy or flexible sigmoidoscopy) you may notice spotting of blood in your stool or on the toilet paper. If you underwent a bowel prep for your procedure, then you may not have a normal bowel movement for a few days.  DIET: Your first meal following the procedure should be a light meal and then it is ok to progress to your normal diet.  A half-sandwich or bowl of soup is an example of a good first meal.  Heavy or fried foods are harder to digest and may make you feel nauseous or bloated.  Likewise meals heavy in dairy and vegetables can cause extra gas to form and this can also increase the bloating.  Drink plenty of fluids but you should avoid alcoholic beverages for 24 hours.  ACTIVITY: Your care partner should take you home directly after the procedure.  You should plan to take it easy, moving slowly for the rest of the day.  You can resume normal activity the day after the procedure however you should NOT DRIVE or use heavy machinery for 24 hours (because of the sedation medicines used during the test).    SYMPTOMS TO REPORT IMMEDIATELY: A gastroenterologist can be reached at  any hour.  During normal business hours, 8:30 AM to 5:00 PM Monday through Friday, call [phone].  After hours and on weekends, please call the GI answering service at [phone] who will take a message and have the physician on call contact you.   Following lower endoscopy (colonoscopy or flexible sigmoidoscopy):  Excessive amounts of blood in the stool  Significant tenderness or worsening of abdominal pains  Swelling of the abdomen that is new, acute  Fever of 100F or higher   High Fiber Diet A high fiber diet changes your normal diet to include more whole grains, legumes, fruits, and vegetables. Changes in the diet involve replacing refined carbohydrates with unrefined foods. The calorie level of the diet is essentially unchanged. The Dietary Reference Intake (recommended amount) for adult males is 38 g per day. For adult females, it is 25 g per day. Pregnant and lactating women should consume 28 g of fiber per day. Fiber is the intact part of a plant that is not broken down during digestion. Functional fiber is fiber that has been isolated from the plant to provide a beneficial effect in the body. PURPOSE  Increase stool bulk.   Ease and regulate bowel movements.   Lower cholesterol.  INDICATIONS THAT YOU NEED MORE FIBER  Constipation and hemorrhoids.   Uncomplicated diverticulosis (intestine condition) and irritable bowel syndrome.   Weight management.   As a protective measure against hardening of  the arteries (atherosclerosis), diabetes, and cancer.  NOTE OF CAUTION If you have a digestive or bowel problem, ask your caregiver for advice before adding high fiber foods to your diet. Some of the following medical problems are such that a high fiber diet should not be used without consulting your caregiver:  Acute diverticulitis (intestine infection).   Partial small bowel obstructions.   Complicated diverticular disease involving bleeding, rupture (perforation), or  abscess (boil, furuncle).   Presence of autonomic neuropathy (nerve damage) or gastric paresis (stomach cannot empty itself).  GUIDELINES FOR INCREASING FIBER  Start adding fiber to the diet slowly. A gradual increase of about 5 more grams (2 slices of whole-wheat bread, 2 servings of most fruits or vegetables, or 1 bowl of high fiber cereal) per day is best. Too rapid an increase in fiber may result in constipation, flatulence, and bloating.   Drink enough water and fluids to keep your urine clear or pale yellow. Water, juice, or caffeine-free drinks are recommended. Not drinking enough fluid may cause constipation.   Eat a variety of high fiber foods rather than one type of fiber.   Try to increase your intake of fiber through using high fiber foods rather than fiber pills or supplements that contain small amounts of fiber.   The goal is to change the types of food eaten. Do not supplement your present diet with high fiber foods, but replace foods in your present diet.  INCLUDE A VARIETY OF FIBER SOURCES  Replace refined and processed grains with whole grains, canned fruits with fresh fruits, and incorporate other fiber sources. White rice, white breads, and most bakery goods contain little or no fiber.   Brown whole-grain rice, buckwheat oats, and many fruits and vegetables are all good sources of fiber. These include: broccoli, Brussels sprouts, cabbage, cauliflower, beets, sweet potatoes, white potatoes (skin on), carrots, tomatoes, eggplant, squash, berries, fresh fruits, and dried fruits.   Cereals appear to be the richest source of fiber. Cereal fiber is found in whole grains and bran. Bran is the fiber-rich outer coat of cereal grain, which is largely removed in refining. In whole-grain cereals, the bran remains. In breakfast cereals, the largest amount of fiber is found in those with "bran" in their names. The fiber content is sometimes indicated on the label.   You may need to  include additional fruits and vegetables each day.   In baking, for 1 cup white flour, you may use the following substitutions:   1 cup whole-wheat flour minus 2 tbs.    cup white flour plus  cup whole-wheat flour.  Document Released: 03/17/2005 Document Revised: 11/27/2010 Document Reviewed: 01/23/2009 [facility] Patient Information 2012 [place], [place].Diverticulosis Diverticulosis is a common condition that develops when small pouches (diverticula) form in the wall of the colon. The risk of diverticulosis increases with age. It happens more often in people who eat a low-fiber diet. Most individuals with diverticulosis have no symptoms. Those individuals with symptoms usually experience abdominal pain, constipation, or loose stools (diarrhea). HOME CARE INSTRUCTIONS   Increase the amount of fiber in your diet as directed by your caregiver or dietician. This may reduce symptoms of diverticulosis.   Your caregiver may recommend taking a dietary fiber supplement.   Drink at least 6 to 8 glasses of water each day to prevent constipation.   Try not to strain when you have a bowel movement.   Your caregiver may recommend avoiding nuts and seeds to prevent complications, although this is still an  uncertain benefit.   Only take over-the-counter or prescription medicines for pain, discomfort, or fever as directed by your caregiver.  FOODS WITH HIGH FIBER CONTENT INCLUDE:  Fruits. Apple, peach, pear, tangerine, raisins, prunes.   Vegetables. Brussels sprouts, asparagus, broccoli, cabbage, carrot, cauliflower, romaine lettuce, spinach, summer squash, tomato, winter squash, zucchini.   Starchy Vegetables. Baked beans, kidney beans, lima beans, split peas, lentils, potatoes (with skin).   Grains. Whole wheat bread, brown rice, bran flake cereal, plain oatmeal, white rice, shredded wheat, bran muffins.  SEEK IMMEDIATE MEDICAL CARE IF:   You develop increasing pain or severe bloating.   You  have an oral temperature above 102 F (38.9 C), not controlled by medicine.   You develop vomiting or bowel movements that are bloody or black.  Document Released: 12/13/2003 Document Revised: 11/27/2010 Document Reviewed: 08/15/2009 [facility] Patient Information 2012 [place], [place]. FOLLOW UP: If any biopsies were taken you will be contacted by phone or by letter within the next 1-3 weeks.  Call your gastroenterologist if you have not heard about the biopsies in 3 weeks.  Our staff will call the home number listed on your records the next business day following your procedure to check on you and address any questions or concerns that you may have at that time regarding the information given to you following your procedure. This is a courtesy call and so if there is no answer at the home number and we have not heard from you through the emergency physician on call, we will assume that you have returned to your regular daily activities without incident.  SIGNATURES/CONFIDENTIALITY: You and/or your care partner have signed paperwork which will be entered into your electronic medical record.  These signatures attest to the fact that that the information above on your After Visit Summary has been reviewed and is understood.  Full responsibility of the confidentiality of this discharge information lies with you and/or your care-partner. Colon Polyps A polyp is extra tissue that grows inside your body. Colon polyps grow in the large intestine. The large intestine, also called the colon, is part of your digestive system. It is a long, hollow tube at the end of your digestive tract where your body makes and stores stool. Most polyps are not dangerous. They are benign. This means they are not cancerous. But over time, some types of polyps can turn into cancer. Polyps that are smaller than a pea are usually not harmful. But larger polyps could someday become or may already be cancerous. To be safe, doctors remove  all polyps and test them.  WHO GETS POLYPS? Anyone can get polyps, but certain people are more likely than others. You may have a greater chance of getting polyps if:  You are over 50.   You have had polyps before.   Someone in your family has had polyps.   Someone in your family has had cancer of the large intestine.   Find out if someone in your family has had polyps. You may also be more likely to get polyps if you:   Eat a lot of fatty foods.   Smoke.   Drink alcohol.   Do not exercise.   Eat too much.  SYMPTOMS  Most small polyps do not cause symptoms. People often do not know they have one until their caregiver finds it during a regular checkup or while testing them for something else. Some people do have symptoms like these:  Bleeding from the anus. You might notice blood  on your underwear or on toilet paper after you have had a bowel movement.   Constipation or diarrhea that lasts more than a week.   Blood in the stool. Blood can make stool look black or it can show up as red streaks in the stool.  If you have any of these symptoms, see your caregiver. HOW DOES THE DOCTOR TEST FOR POLYPS? The doctor can use four tests to check for polyps:  Digital rectal exam. The caregiver wears gloves and checks your rectum (the last part of the large intestine) to see if it feels normal. This test would find polyps only in the rectum. Your caregiver may need to do one of the other tests listed below to find polyps higher up in the intestine.   Barium enema. The caregiver puts a liquid called barium into your rectum before taking x-rays of your large intestine. Barium makes your intestine look white in the pictures. Polyps are dark, so they are easy to see.   Sigmoidoscopy. With this test, the caregiver can see inside your large intestine. A thin flexible tube is placed into your rectum. The device is called a sigmoidoscope, which has a light and a tiny video camera in it. The  caregiver uses the sigmoidoscope to look at the last third of your large intestine.   Colonoscopy. This test is like sigmoidoscopy, but the caregiver looks at all of the large intestine. It usually requires sedation. This is the most common method for finding and removing polyps.  TREATMENT   The caregiver will remove the polyp during sigmoidoscopy or colonoscopy. The polyp is then tested for cancer.   If you have had polyps, your caregiver may want you to get tested regularly in the future.  PREVENTION  There is not one sure way to prevent polyps. You might be able to lower your risk of getting them if you:  Eat more fruits and vegetables and less fatty food.   Do not smoke.   Avoid alcohol.   Exercise every day.   Lose weight if you are overweight.   Eating more calcium and folate can also lower your risk of getting polyps. Some foods that are rich in calcium are milk, cheese, and broccoli. Some foods that are rich in folate are chickpeas, kidney beans, and spinach.   Aspirin might help prevent polyps. Studies are under way.  Document Released: 12/12/2003 Document Revised: 11/27/2010 Document Reviewed: 05/19/2007 [facility] Patient Information 2012 [place], [place].

## 2011-05-22 NOTE — Op Note (Signed)
[facility] [address] [place], [place]  [address]  COLONOSCOPY PROCEDURE REPORT PATIENT:  Rodney Parker, Rodney Parker  MR#:  [id number] BIRTHDATE:  22-Jun-1950, 60 yrs. old  GENDER:  male ENDOSCOPIST:  Judie Petit T. Russella Dar, MD, [facility]  PROCEDURE DATE:  05/22/2011 PROCEDURE:  Colonoscopy with biopsy and snare polypectomy ASA CLASS:  Class II INDICATIONS:  1) Routine Risk Screening MEDICATIONS:   These medications were titrated to patient response per physician's verbal order, Fentanyl 100 mcg IV, Versed 10 mg IV DESCRIPTION OF PROCEDURE:  After the risks benefits and alternatives of the procedure were thoroughly explained, informed consent was obtained.  Digital rectal exam was performed and revealed no abnormalities.  The LB CF-[id number] endoscope was introduced through the anus and advanced to the cecum, which was identified by both the appendix and ileocecal valve, without limitations. The quality of the prep was good, using MoviPrep. The instrument was then slowly withdrawn as the colon was fully examined. <<PROCEDUREIMAGES>> FINDINGS:  A sessile polyp was found in the cecum. It was 6 mm in size. Polyp was snared without cautery. Retrieval was successful. A sessile polyp was found in the cecum. It was 3 mm in size. The polyp was removed using cold biopsy forceps.  A diverticulum was found in the ascending colon.  Moderate diverticulosis was found in the sigmoid colon.  Otherwise normal colonoscopy without other polyps, masses, vascular ectasias, or inflammatory changes. Retroflexed views in the rectum revealed no abnormalities.  The time to cecum =  4  minutes. The scope was then withdrawn (time = 9.5  min) from the patient and the procedure completed.  COMPLICATIONS:  None  ENDOSCOPIC IMPRESSION: 1) 6 mm sessile polyp in the cecum 2) 3 mm sessile polyp in the cecum 3) Diverticulum in the ascending colon 4) Moderate diverticulosis in the sigmoid  colon  RECOMMENDATIONS: 1) Await pathology results 2) High fiber diet with liberal fluid intake. 3) If the polyps are adenomatous (pre-cancerous), repeat colonoscopy in 5 years. Otherwise follow colorectal cancer screening guidelines for "routine risk" patients with colonoscopy in 10 years.  Venita Lick. Russella Dar, MD, [facility]  n. eSIGNED:   Venita Lick. Shaylyn Bawa at 05/22/2011 09:36 AM  Orson Eva, [phone]

## 2011-05-23 ENCOUNTER — Telehealth: Payer: Self-pay | Admitting: *Deleted

## 2011-05-23 NOTE — Telephone Encounter (Signed)
Left message on number given in admitting yesterday with ok to leave this message. ewm

## 2011-05-27 ENCOUNTER — Encounter: Payer: Self-pay | Admitting: Gastroenterology

## 2011-06-30 ENCOUNTER — Other Ambulatory Visit: Payer: Self-pay | Admitting: Internal Medicine

## 2011-06-30 NOTE — Telephone Encounter (Signed)
Refill done.  

## 2011-08-16 ENCOUNTER — Other Ambulatory Visit: Payer: Self-pay | Admitting: Internal Medicine

## 2011-09-16 ENCOUNTER — Other Ambulatory Visit: Payer: Self-pay | Admitting: Internal Medicine

## 2012-01-01 ENCOUNTER — Encounter: Payer: Self-pay | Admitting: Internal Medicine

## 2012-01-01 ENCOUNTER — Ambulatory Visit (INDEPENDENT_AMBULATORY_CARE_PROVIDER_SITE_OTHER): Payer: BC Managed Care – PPO | Admitting: Internal Medicine

## 2012-01-01 VITALS — BP 118/80 | HR 69 | Temp 98.8°F | Resp 14 | Wt 189.4 lb

## 2012-01-01 DIAGNOSIS — Z23 Encounter for immunization: Secondary | ICD-10-CM

## 2012-01-01 DIAGNOSIS — M5412 Radiculopathy, cervical region: Secondary | ICD-10-CM

## 2012-01-01 DIAGNOSIS — E785 Hyperlipidemia, unspecified: Secondary | ICD-10-CM

## 2012-01-01 DIAGNOSIS — I10 Essential (primary) hypertension: Secondary | ICD-10-CM

## 2012-01-01 MED ORDER — BENAZEPRIL HCL 40 MG PO TABS: 40.0000 mg | ORAL_TABLET | Freq: Every day | ORAL | Status: DC

## 2012-01-01 MED ORDER — METOPROLOL TARTRATE 50 MG PO TABS: 50.0000 mg | ORAL_TABLET | Freq: Two times a day (BID) | ORAL | Status: DC

## 2012-01-01 NOTE — Patient Instructions (Addendum)
Sleep with a cervical pillow & take Gabapentin @ bedtime. Order for x-rays entered into  the computer; these will be performed at [facility] on [address]. No appointment is necessary.  Please  schedule fasting Labs : BMET,Lipids, hepatic panel,TSH.PLEASE BRING THESE INSTRUCTIONS TO FOLLOW UP  LAB APPOINTMENT.This will guarantee correct labs are drawn, eliminating need for repeat blood sampling ( needle sticks ! ). Diagnoses /Codes: 401.9,272.4,995.20   If you activate My Chart; the results can be released to you as soon as they populate from the lab. If you choose not to use this program; the labs have to be reviewed, copied & mailed   causing a delay in getting the results to you.

## 2012-01-01 NOTE — Progress Notes (Signed)
  Subjective:    Patient ID: Rodney Parker, male    DOB: [birth year], 61 y.o.   MRN: [id number]  HPI He has noted stiffness in his neck for the last 3-4 months since a masseuse manipulated his neck quickly. The symptoms are intermediate and associated with tingling of the left upper back 3-6 times per day lasting seconds. The symptoms are aggravated by position change , especially L lateral rotation; but it also does occur at night. He has noted the sensation of a lump in the left neck posteriorly which disappears when he rotates his head laterally to the left.NSAIDS do provide some relief.  He has a history of cervical disc disease at 6-7 with symptoms of numbness of the right index finger    Review of Systems  He denies weakness in the left upper extremity. He also has no constitutional symptoms of fever, chills, sweats, or weight loss. There's no associated  headache or back pain. He denies incontinence of urine or stool.  Is requesting refills of his blood pressure medicines. Blood pressure has been well controlled. He denies chest pain, palpitations, shortness of breath, or edema  His last labs were 11/12. Triglycerides at that time over 400.       Objective:   Physical Exam  Gen. appearance: Well-nourished, in no distress Eyes: Extraocular motion intact, field of vision normal, vision grossly intact, no nystagmus. There is thinning of the lateral eyebrows. He states he does trim his eyebrows. ENT: Canals clear, tympanic membranes normal, tuning fork exam normal, hearing grossly normal Neck:  no masses, normal thyroid. There is decreased left lateral rotation  Muscle skeletal: Upper extremity tone, &  strength normal. There is insome lordotic curve of the lower thoracic spine area Neuro:no cranial nerve deficit, deep tendon  reflexes normal, gait normal. Lymph: No cervical or axillary LA Skin: Warm and dry without suspicious lesions or rashes Psych: no anxiety or mood change.  Normally interactive and cooperative.         Assessment & Plan:  #1 left C 3 positional radicular symptoms. 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  Plan: See orders and recommendations

## 2012-01-02 ENCOUNTER — Ambulatory Visit (HOSPITAL_BASED_OUTPATIENT_CLINIC_OR_DEPARTMENT_OTHER)
Admission: RE | Admit: 2012-01-02 | Discharge: 2012-01-02 | Disposition: A | Discharge status: Home / Self Care | Payer: BC Managed Care – PPO | Source: Ambulatory Visit | Attending: Internal Medicine | Admitting: Internal Medicine

## 2012-01-02 DIAGNOSIS — M5412 Radiculopathy, cervical region: Secondary | ICD-10-CM

## 2012-01-02 DIAGNOSIS — M5030 Other cervical disc degeneration, unspecified cervical region: Secondary | ICD-10-CM | POA: Insufficient documentation

## 2012-01-06 ENCOUNTER — Telehealth: Payer: Self-pay

## 2012-01-06 NOTE — Telephone Encounter (Signed)
Spoke with pt advised him Dr. Sherren Mocha reviewed xray yet once results are done we will get in contact with him. Pt states understanding.    MW

## 2012-01-12 ENCOUNTER — Telehealth: Payer: Self-pay

## 2012-01-12 NOTE — Telephone Encounter (Signed)
Advised pt of xray results and mailed pt a copy.     MW

## 2012-01-23 ENCOUNTER — Telehealth: Payer: Self-pay

## 2012-01-23 NOTE — Telephone Encounter (Signed)
Per PCP 

## 2012-01-23 NOTE — Telephone Encounter (Signed)
Pt called left message on triage line stating when he came in for OV everything was gone over with him but his biggest concern which is the lump on back of neck. Pt wants to be advised if he should be concerned or what to do. I called pt back to advise him I received his message. Pt stated pain level is 6 and hurts mostly when he turns. Pt states lump is under skin no leakage. Plz advise     MW

## 2012-01-26 ENCOUNTER — Other Ambulatory Visit: Payer: Self-pay | Admitting: Internal Medicine

## 2012-01-26 DIAGNOSIS — M5412 Radiculopathy, cervical region: Secondary | ICD-10-CM

## 2012-01-29 ENCOUNTER — Other Ambulatory Visit: Payer: Self-pay | Admitting: Internal Medicine

## 2012-01-29 DIAGNOSIS — M5090 Cervical disc disorder, unspecified, unspecified cervical region: Secondary | ICD-10-CM

## 2012-01-29 NOTE — Telephone Encounter (Signed)
I spoke with patient, patient states that he would rather follow-up with Dr.Hopper in December for his CPX and further discuss. Patient requested Neurosurgical consult be delayed for now. (order removed)

## 2012-01-29 NOTE — Addendum Note (Signed)
Addended by: Maurice Small on: 01/29/2012 04:47 PM   Modules accepted: Orders

## 2012-01-29 NOTE — Telephone Encounter (Signed)
C. 01/12/12 addendum to cervical spine films. I am recommending a neurosurgical consultation; I asked whether he had a preference. I will  put the generic consult request in

## 2012-06-22 ENCOUNTER — Telehealth: Payer: Self-pay | Admitting: Internal Medicine

## 2012-06-22 MED ORDER — BENZONATATE 200 MG PO CAPS: 200.0000 mg | ORAL_CAPSULE | Freq: Three times a day (TID) | ORAL | Status: AC | PRN

## 2012-06-22 NOTE — Telephone Encounter (Signed)
Patient aware rx sent in  

## 2012-06-22 NOTE — Telephone Encounter (Signed)
200 mg q 8 hrs prn #15

## 2012-06-22 NOTE — Telephone Encounter (Signed)
Patient Information:  Caller Name: Jasmine December  Phone: [phone]  Patient: Rodney Parker, Rodney Parker  Gender: Male  DOB: [birth year]  Age: 62 Years  PCP: Marga Melnick  Office Follow Up:  Does the office need to follow up with this patient?: Yes  Instructions For The Office: Please follow up with patient on script request...PATIENT IS LEAVING TO GO OUT OF TOWN AT 2:00 TODAY 06/22/12.  RN Note:  Patient is on Ace inhibitor however states that wife is also sick with the same symptoms and was diagnosed viral and patient started with symptoms the day after wife.  Patient did have productive cough with yellow sputum after taking Mucinex that has now turned into a dry hacking cough. Patient states that his wife is taking cough medicine with codeine however he is allergic to codeine and is requesting a script for Tessalon Pearls if the provider is willing to call that in to help him sleep at night.  Patient states that he is leaving today at 2:00 to go out of town and will not be back until Friday 06/25/12. Preferred pharmacy is [facility] - [address] in [place].  Symptoms  Reason For Call & Symptoms: Cough  Reviewed Health History In EMR: Yes  Reviewed Medications In EMR: Yes  Reviewed Allergies In EMR: Yes  Reviewed Surgeries / Procedures: Yes  Date of Onset of Symptoms: 06/15/2012  Treatments Tried: Robitussin, Mucinex  Treatments Tried Worked: No  Guideline(s) Used:  Cough  Disposition Per Guideline:   See Within 3 Days in Office  Reason For Disposition Reached:   Taking an ACE Inhibitor medication (e.g., benazepril/LOTENSIN, captopril/CAPOTEN, enalapril/VASOTEC, lisinopril/ZESTRIL)  Advice Given:  Call Back If:  Difficulty breathing  You become worse.  Patient Refused Recommendation:  Patient Requests Prescription  Patient is going out of town and has a dry cough and is requesting Tessalon Pearls script to be called in to help him sleep at night.  Patient is allergic to  Codeine.

## 2012-06-22 NOTE — Telephone Encounter (Signed)
Hopp please advise on patient's request for tessalon pearls

## 2012-12-13 ENCOUNTER — Other Ambulatory Visit: Payer: Self-pay | Admitting: *Deleted

## 2012-12-13 DIAGNOSIS — I10 Essential (primary) hypertension: Secondary | ICD-10-CM

## 2012-12-13 MED ORDER — BENAZEPRIL HCL 40 MG PO TABS: 40.0000 mg | ORAL_TABLET | Freq: Every day | ORAL | Status: DC

## 2012-12-13 NOTE — Telephone Encounter (Signed)
Rx was refilled for Benazepril 40 mg.  ag cma

## 2013-02-03 ENCOUNTER — Other Ambulatory Visit: Payer: Self-pay

## 2013-02-21 ENCOUNTER — Other Ambulatory Visit: Payer: Self-pay | Admitting: Internal Medicine

## 2013-02-22 NOTE — Telephone Encounter (Signed)
Benazepril refilled per protocol. OV due

## 2013-03-29 ENCOUNTER — Other Ambulatory Visit: Payer: Self-pay | Admitting: Internal Medicine

## 2013-03-29 NOTE — Telephone Encounter (Signed)
Metoprolol refilled for one month only because patient needs OV. JG//CMA

## 2013-04-18 ENCOUNTER — Other Ambulatory Visit: Payer: Self-pay | Admitting: Internal Medicine

## 2013-04-18 NOTE — Telephone Encounter (Signed)
Benazepril refilled for one month, OV due. JG//CMA

## 2013-05-23 ENCOUNTER — Other Ambulatory Visit: Payer: Self-pay | Admitting: Internal Medicine

## 2013-05-24 NOTE — Telephone Encounter (Signed)
Rx sent to the pharmacy by e-script.  Pt need complete physical.//AB/CMA

## 2014-02-01 IMAGING — CR DG CERVICAL SPINE COMPLETE 4+V
7 series · 7 of 7 positions shown · non-contrast
Comparison: None.

CLINICAL DATA: 50-year-old male pain radiating to the medial
scapula.

CERVICAL SPINE - COMPLETE 4+ VIEW

[w c-spine lat]
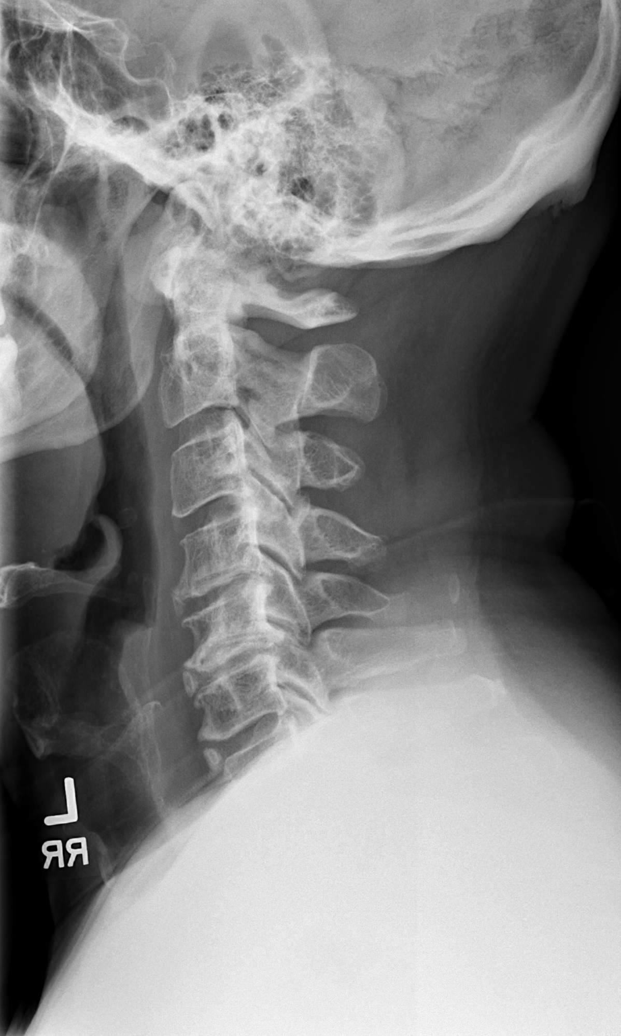

[w c-spine oblique (1 of 2)]
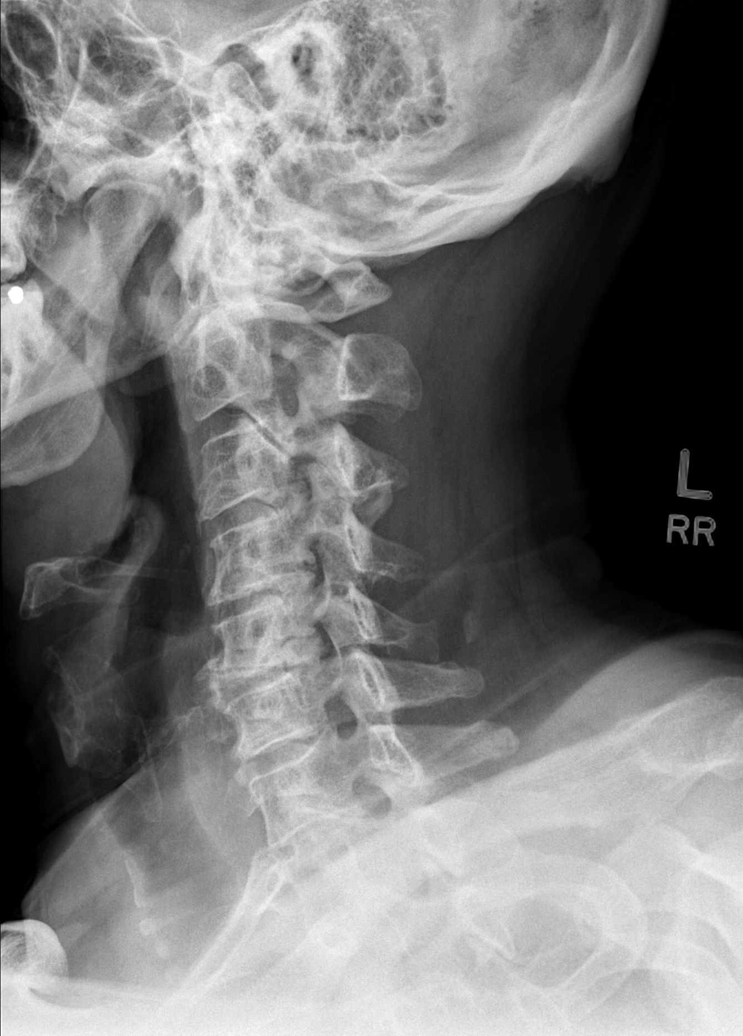

[w c-spine oblique (2 of 2)]
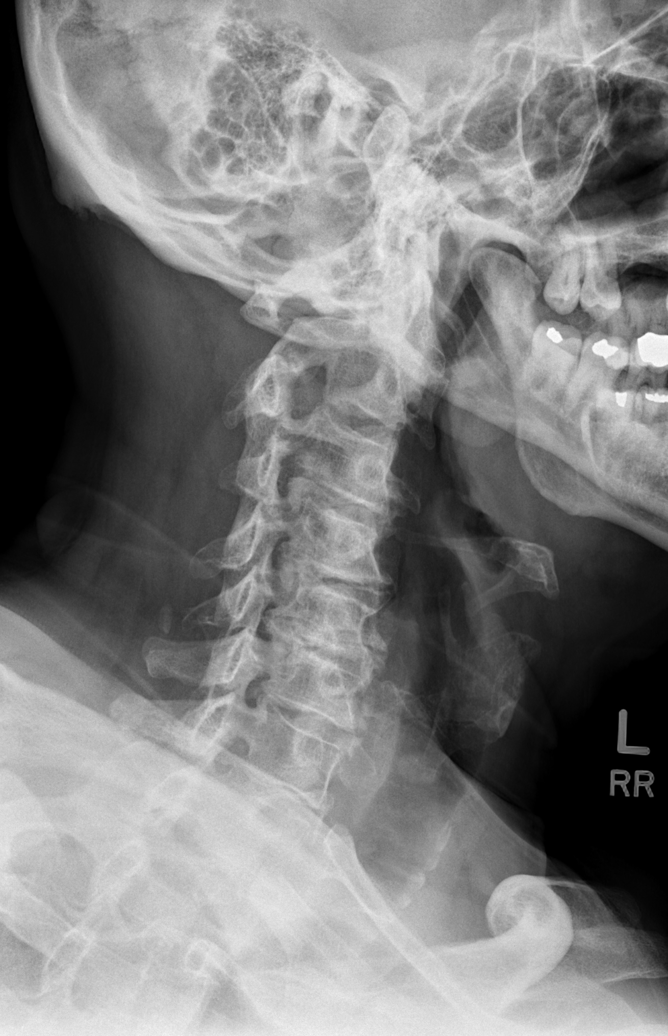

[w c-spine a.p.]
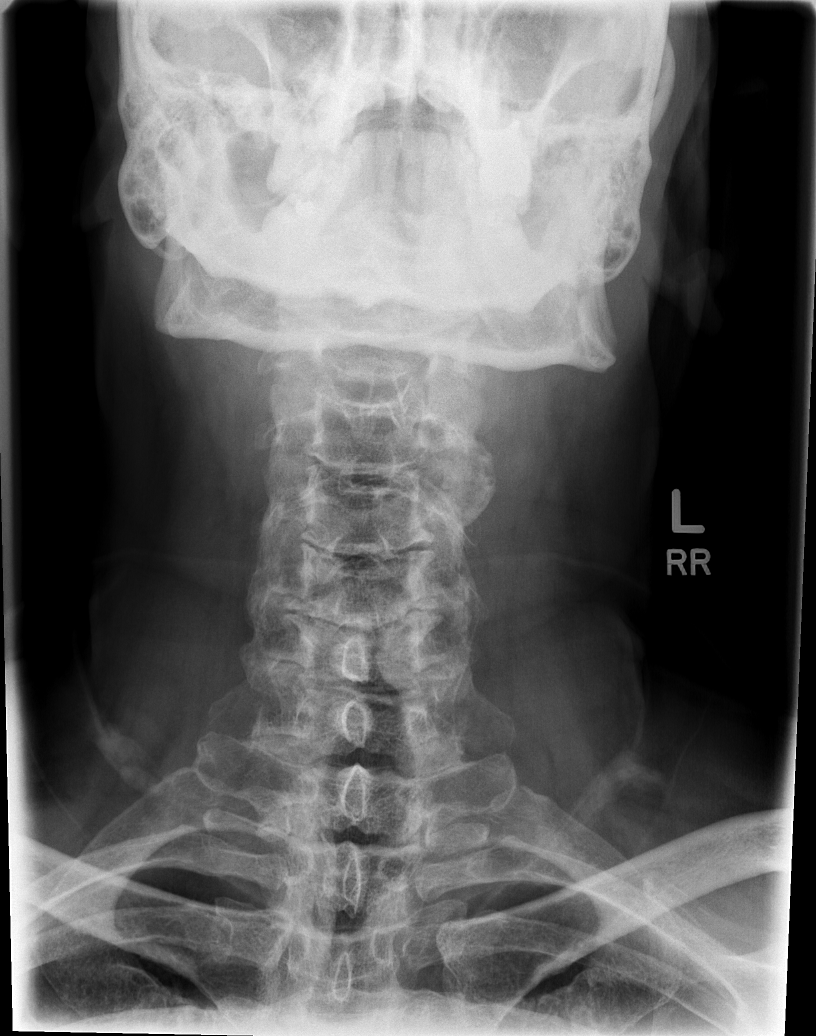

[w c-spine odontoid (1 of 2)]
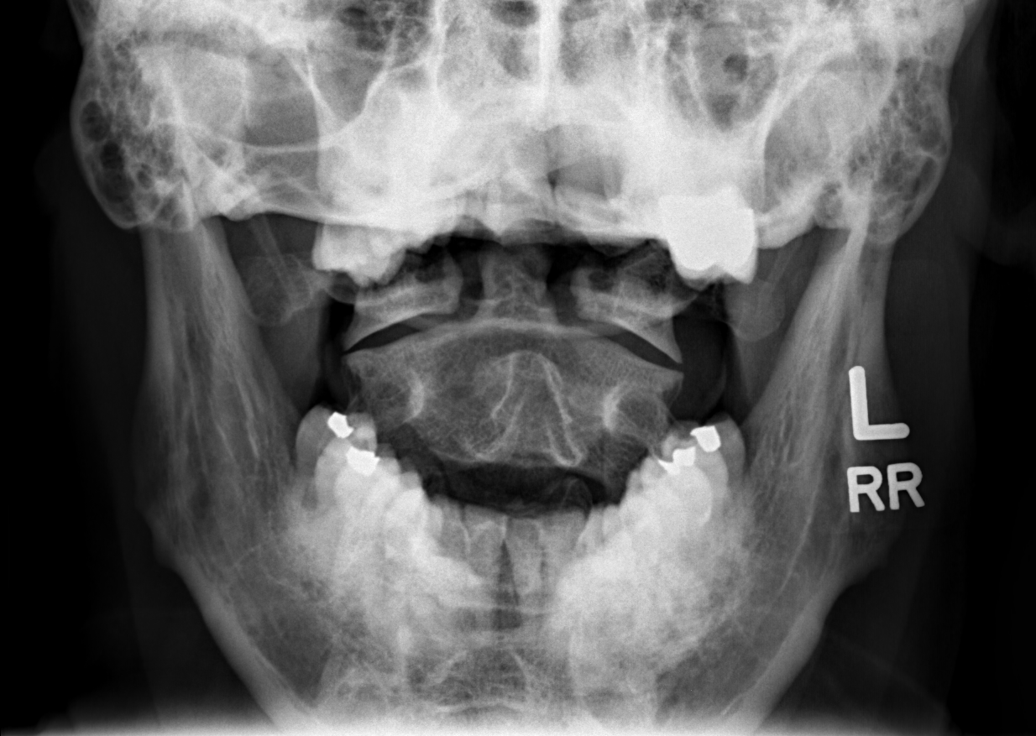

[w c-spine odontoid (2 of 2)]
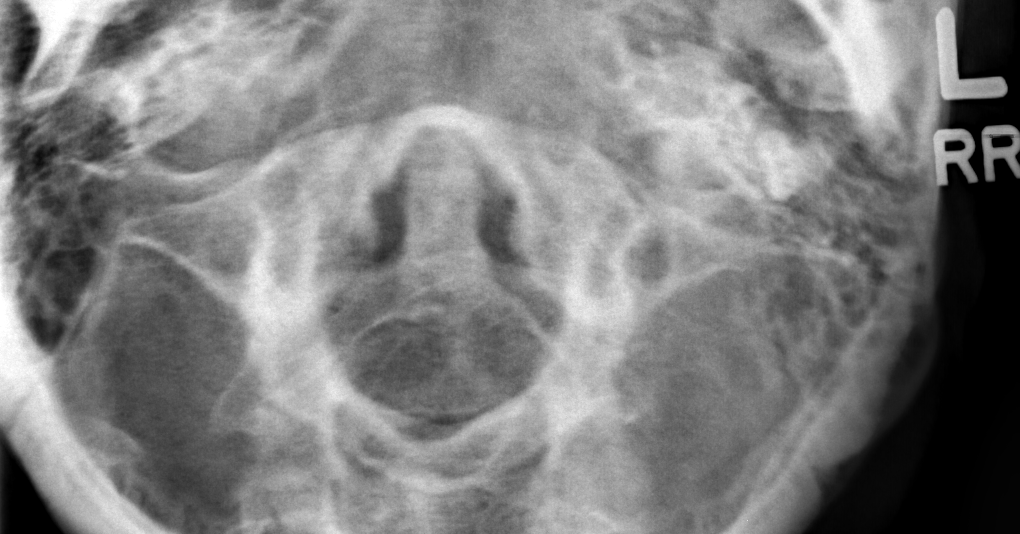

[w swimmers view]
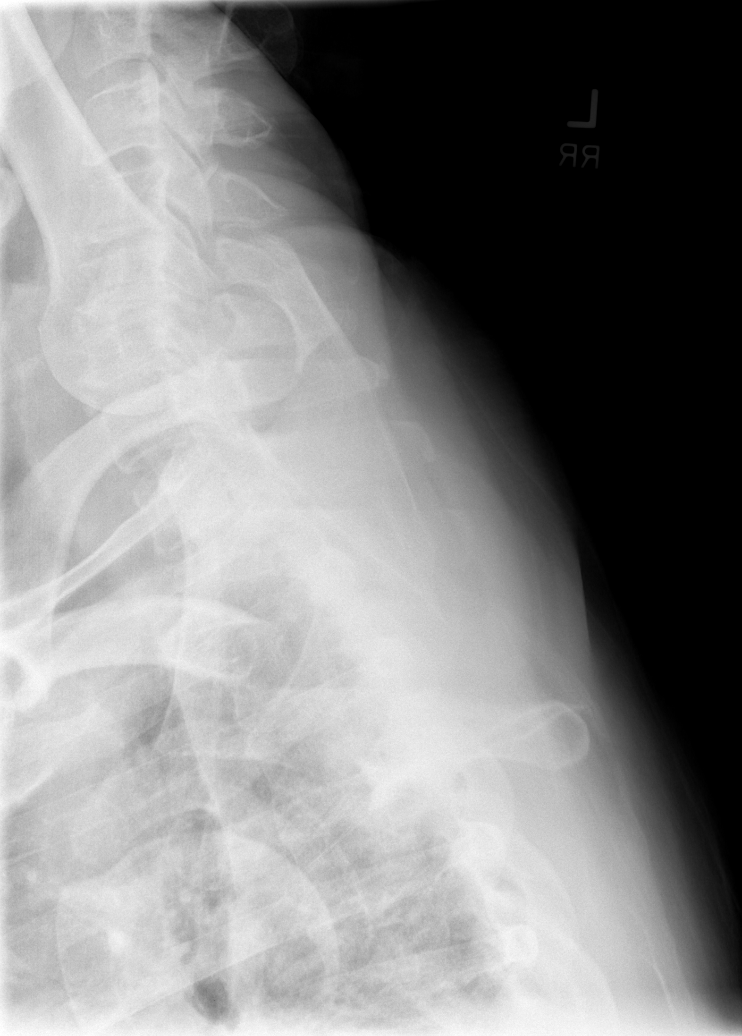

[7 of 7 positions shown; findings below may reference images not displayed]

FINDINGS: Straightening of cervical lordosis.  Prevertebral soft
tissue contour within normal limits.  Moderate disc space loss and
endplate spurring at C4-C5 and C5-C6. Bilateral posterior element
alignment is within normal limits.  Cervicothoracic junction
alignment is within normal limits.  AP alignment within normal
limits.  Evidence of severe facet hypertrophy on the left at C3-C4.
Lung apices are clear.  C1-C2 alignment and odontoid within normal
limits.
IMPRESSION: C3-C4 facet degeneration and C4-C5, C5-C6 disc degeneration.

## 2016-04-10 ENCOUNTER — Encounter: Payer: Self-pay | Admitting: Gastroenterology
